# Patient Record
Sex: Female | Born: 1995 | Race: Black or African American | Hispanic: No | Marital: Single | Smoking: Never smoker
Health system: Southern US, Community
[De-identification: ages and names within clinical notes are randomized; demographics above are authoritative.]

## PROBLEM LIST (undated history)

## (undated) DIAGNOSIS — H409 Unspecified glaucoma: Secondary | ICD-10-CM

---

## 2017-04-02 ENCOUNTER — Emergency Department (HOSPITAL_COMMUNITY): Payer: No Typology Code available for payment source

## 2017-04-02 ENCOUNTER — Emergency Department (HOSPITAL_COMMUNITY)
Admission: EM | Admit: 2017-04-02 | Discharge: 2017-04-02 | Disposition: A | Discharge status: Home / Self Care | Payer: No Typology Code available for payment source | Attending: Emergency Medicine | Admitting: Emergency Medicine

## 2017-04-02 ENCOUNTER — Encounter (HOSPITAL_COMMUNITY): Payer: Self-pay | Admitting: Emergency Medicine

## 2017-04-02 DIAGNOSIS — S161XXA Strain of muscle, fascia and tendon at neck level, initial encounter: Secondary | ICD-10-CM | POA: Diagnosis not present

## 2017-04-02 DIAGNOSIS — Y939 Activity, unspecified: Secondary | ICD-10-CM | POA: Diagnosis not present

## 2017-04-02 DIAGNOSIS — Y929 Unspecified place or not applicable: Secondary | ICD-10-CM | POA: Diagnosis not present

## 2017-04-02 DIAGNOSIS — Y999 Unspecified external cause status: Secondary | ICD-10-CM | POA: Insufficient documentation

## 2017-04-02 DIAGNOSIS — S20212A Contusion of left front wall of thorax, initial encounter: Secondary | ICD-10-CM

## 2017-04-02 DIAGNOSIS — S5012XA Contusion of left forearm, initial encounter: Secondary | ICD-10-CM | POA: Insufficient documentation

## 2017-04-02 DIAGNOSIS — S199XXA Unspecified injury of neck, initial encounter: Secondary | ICD-10-CM | POA: Diagnosis present

## 2017-04-02 MED ORDER — CYCLOBENZAPRINE HCL 5 MG PO TABS: 5.0000 mg | ORAL_TABLET | Freq: Two times a day (BID) | ORAL | 0 refills | Status: DC | PRN

## 2017-04-02 MED ORDER — NAPROXEN 375 MG PO TABS: 375.0000 mg | ORAL_TABLET | Freq: Two times a day (BID) | ORAL | 0 refills | Status: DC
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
## 2019-11-03 ENCOUNTER — Emergency Department
Admission: EM | Admit: 2019-11-03 | Discharge: 2019-11-03 | Disposition: A | Discharge status: Home / Self Care | Payer: No Typology Code available for payment source | Attending: Emergency Medicine | Admitting: Emergency Medicine

## 2019-11-03 ENCOUNTER — Other Ambulatory Visit: Payer: Self-pay

## 2019-11-03 DIAGNOSIS — M79631 Pain in right forearm: Secondary | ICD-10-CM | POA: Insufficient documentation

## 2019-11-03 DIAGNOSIS — Y999 Unspecified external cause status: Secondary | ICD-10-CM | POA: Insufficient documentation

## 2019-11-03 DIAGNOSIS — Y929 Unspecified place or not applicable: Secondary | ICD-10-CM | POA: Insufficient documentation

## 2019-11-03 DIAGNOSIS — R0789 Other chest pain: Secondary | ICD-10-CM | POA: Diagnosis not present

## 2019-11-03 DIAGNOSIS — Y939 Activity, unspecified: Secondary | ICD-10-CM | POA: Diagnosis not present

## 2019-11-03 DIAGNOSIS — H1132 Conjunctival hemorrhage, left eye: Secondary | ICD-10-CM | POA: Diagnosis present

## 2019-11-03 DIAGNOSIS — M79632 Pain in left forearm: Secondary | ICD-10-CM | POA: Diagnosis not present

## 2019-11-03 DIAGNOSIS — R519 Headache, unspecified: Secondary | ICD-10-CM | POA: Insufficient documentation

## 2019-11-03 HISTORY — DX: Unspecified glaucoma: H40.9

## 2019-11-03 MED ORDER — NAPROXEN 500 MG PO TABS
500.0000 mg | ORAL_TABLET | Freq: Once | ORAL | Status: AC
  Administered 2019-11-03: 16:00:00 500 mg via ORAL
  Filled 2019-11-03: qty 1

## 2019-11-03 MED ORDER — METHOCARBAMOL 500 MG PO TABS: 500.0000 mg | ORAL_TABLET | Freq: Three times a day (TID) | ORAL | 0 refills | Status: AC

## 2019-11-03 MED ORDER — NAPROXEN 500 MG PO TABS: 500.0000 mg | ORAL_TABLET | Freq: Two times a day (BID) | ORAL | 0 refills | Status: AC

## 2019-11-03 MED ORDER — METHOCARBAMOL 500 MG PO TABS
500.0000 mg | ORAL_TABLET | Freq: Once | ORAL | Status: AC
  Administered 2019-11-03: 16:00:00 500 mg via ORAL
  Filled 2019-11-03: qty 1
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
## 2019-11-03 NOTE — Discharge Instructions (Signed)
Please call and schedule follow-up appointment with your primary care provider or urgent care if not improving over the next week or so.  Use ice over the sore areas 20 minutes/h off and on throughout the day.  Return to the emergency department for symptoms of concern if unable to see primary care.
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
